# Patient Record
Sex: Male | Born: 1995 | Race: White | Hispanic: No | Marital: Married | Smoking: Never smoker
Health system: Southern US, Community
[De-identification: ages and names within clinical notes are randomized; demographics above are authoritative.]

## PROBLEM LIST (undated history)

## (undated) DIAGNOSIS — M25312 Other instability, left shoulder: Secondary | ICD-10-CM

## (undated) DIAGNOSIS — R203 Hyperesthesia: Secondary | ICD-10-CM

## (undated) DIAGNOSIS — M2520 Flail joint, unspecified joint: Secondary | ICD-10-CM

## (undated) DIAGNOSIS — M2410 Other articular cartilage disorders, unspecified site: Secondary | ICD-10-CM

## (undated) DIAGNOSIS — Z8709 Personal history of other diseases of the respiratory system: Secondary | ICD-10-CM

## (undated) DIAGNOSIS — S43006A Unspecified dislocation of unspecified shoulder joint, initial encounter: Secondary | ICD-10-CM

## (undated) HISTORY — PX: NEVUS EXCISION: SHX2090

---
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
## 2014-03-23 DIAGNOSIS — M2410 Other articular cartilage disorders, unspecified site: Secondary | ICD-10-CM

## 2014-03-23 DIAGNOSIS — S43006A Unspecified dislocation of unspecified shoulder joint, initial encounter: Secondary | ICD-10-CM

## 2014-03-23 HISTORY — DX: Other articular cartilage disorders, unspecified site: M24.10

## 2014-03-23 HISTORY — DX: Unspecified dislocation of unspecified shoulder joint, initial encounter: S43.006A

## 2014-04-13 ENCOUNTER — Other Ambulatory Visit: Payer: Self-pay | Admitting: Orthopedic Surgery

## 2014-04-13 ENCOUNTER — Encounter (HOSPITAL_BASED_OUTPATIENT_CLINIC_OR_DEPARTMENT_OTHER): Payer: Self-pay | Admitting: *Deleted

## 2014-04-20 ENCOUNTER — Ambulatory Visit (HOSPITAL_BASED_OUTPATIENT_CLINIC_OR_DEPARTMENT_OTHER)
Admission: RE | Admit: 2014-04-20 | Discharge: 2014-04-20 | Disposition: A | Discharge status: Home / Self Care | Payer: BC Managed Care – PPO | Source: Ambulatory Visit | Attending: Orthopedic Surgery | Admitting: Orthopedic Surgery

## 2014-04-20 ENCOUNTER — Encounter (HOSPITAL_BASED_OUTPATIENT_CLINIC_OR_DEPARTMENT_OTHER)
Admission: RE | Disposition: A | Discharge status: Home / Self Care | Payer: Self-pay | Source: Ambulatory Visit | Attending: Orthopedic Surgery

## 2014-04-20 ENCOUNTER — Encounter (HOSPITAL_BASED_OUTPATIENT_CLINIC_OR_DEPARTMENT_OTHER): Payer: Self-pay | Admitting: *Deleted

## 2014-04-20 ENCOUNTER — Ambulatory Visit (HOSPITAL_BASED_OUTPATIENT_CLINIC_OR_DEPARTMENT_OTHER): Payer: BC Managed Care – PPO | Admitting: Anesthesiology

## 2014-04-20 ENCOUNTER — Encounter (HOSPITAL_BASED_OUTPATIENT_CLINIC_OR_DEPARTMENT_OTHER): Payer: BC Managed Care – PPO | Admitting: Anesthesiology

## 2014-04-20 DIAGNOSIS — M25312 Other instability, left shoulder: Secondary | ICD-10-CM

## 2014-04-20 DIAGNOSIS — M24819 Other specific joint derangements of unspecified shoulder, not elsewhere classified: Secondary | ICD-10-CM | POA: Insufficient documentation

## 2014-04-20 HISTORY — PX: SHOULDER ARTHROSCOPY WITH BANKART REPAIR: SHX5673

## 2014-04-20 HISTORY — DX: Other instability, left shoulder: M25.312

## 2014-04-20 HISTORY — DX: Hyperesthesia: R20.3

## 2014-04-20 HISTORY — DX: Flail joint, unspecified joint: M25.20

## 2014-04-20 HISTORY — DX: Unspecified dislocation of unspecified shoulder joint, initial encounter: S43.006A

## 2014-04-20 HISTORY — DX: Personal history of other diseases of the respiratory system: Z87.09

## 2014-04-20 HISTORY — DX: Other articular cartilage disorders, unspecified site: M24.10

## 2014-04-20 LAB — POCT HEMOGLOBIN-HEMACUE: Hemoglobin: 15.8 g/dL (ref 13.0–17.0)

## 2014-04-20 SURGERY — SHOULDER ARTHROSCOPY WITH BANKART REPAIR
Anesthesia: General | Site: Shoulder | Laterality: Left

## 2014-04-20 MED ORDER — ACETAMINOPHEN 160 MG/5ML PO SOLN: 325.0000 mg | ORAL | Status: DC | PRN

## 2014-04-20 MED ORDER — FENTANYL CITRATE 0.05 MG/ML IJ SOLN
INTRAMUSCULAR | Status: AC
  Filled 2014-04-20: qty 2

## 2014-04-20 MED ORDER — PROPOFOL 10 MG/ML IV BOLUS
INTRAVENOUS | Status: DC | PRN
  Administered 2014-04-20: 50 mg via INTRAVENOUS
  Administered 2014-04-20: 200 mg via INTRAVENOUS

## 2014-04-20 MED ORDER — ONDANSETRON HCL 4 MG/2ML IJ SOLN
INTRAMUSCULAR | Status: DC | PRN
  Administered 2014-04-20: 4 mg via INTRAVENOUS

## 2014-04-20 MED ORDER — FENTANYL CITRATE 0.05 MG/ML IJ SOLN
25.0000 ug | INTRAMUSCULAR | Status: DC | PRN
  Administered 2014-04-20: 50 ug via INTRAVENOUS

## 2014-04-20 MED ORDER — OXYCODONE-ACETAMINOPHEN 10-325 MG PO TABS: 1.0000 | ORAL_TABLET | Freq: Four times a day (QID) | ORAL | Status: AC | PRN

## 2014-04-20 MED ORDER — MIDAZOLAM HCL 2 MG/2ML IJ SOLN
INTRAMUSCULAR | Status: AC
  Filled 2014-04-20: qty 2

## 2014-04-20 MED ORDER — PROMETHAZINE HCL 25 MG PO TABS: 25.0000 mg | ORAL_TABLET | Freq: Four times a day (QID) | ORAL | Status: AC | PRN

## 2014-04-20 MED ORDER — KETOROLAC TROMETHAMINE 30 MG/ML IJ SOLN: 15.0000 mg | Freq: Once | INTRAMUSCULAR | Status: DC | PRN

## 2014-04-20 MED ORDER — MIDAZOLAM HCL 2 MG/ML PO SYRP: 12.0000 mg | ORAL_SOLUTION | Freq: Once | ORAL | Status: DC | PRN

## 2014-04-20 MED ORDER — LACTATED RINGERS IV SOLN
INTRAVENOUS | Status: DC
Start: 2014-04-20 — End: 2014-04-20
  Administered 2014-04-20: 08:00:00 via INTRAVENOUS

## 2014-04-20 MED ORDER — LIDOCAINE HCL (CARDIAC) 20 MG/ML IV SOLN
INTRAVENOUS | Status: DC | PRN
  Administered 2014-04-20: 50 mg via INTRAVENOUS

## 2014-04-20 MED ORDER — OXYCODONE HCL 5 MG/5ML PO SOLN: 5.0000 mg | Freq: Once | ORAL | Status: AC | PRN

## 2014-04-20 MED ORDER — MIDAZOLAM HCL 2 MG/2ML IJ SOLN
1.0000 mg | INTRAMUSCULAR | Status: DC | PRN
  Administered 2014-04-20: 2 mg via INTRAVENOUS

## 2014-04-20 MED ORDER — OXYCODONE HCL 5 MG PO TABS
5.0000 mg | ORAL_TABLET | Freq: Once | ORAL | Status: AC | PRN
  Administered 2014-04-20: 5 mg via ORAL

## 2014-04-20 MED ORDER — PROMETHAZINE HCL 25 MG/ML IJ SOLN: 6.2500 mg | INTRAMUSCULAR | Status: DC | PRN

## 2014-04-20 MED ORDER — SENNA-DOCUSATE SODIUM 8.6-50 MG PO TABS: 2.0000 | ORAL_TABLET | Freq: Every day | ORAL | Status: AC

## 2014-04-20 MED ORDER — OXYCODONE HCL 5 MG PO TABS
ORAL_TABLET | ORAL | Status: AC
  Filled 2014-04-20: qty 1

## 2014-04-20 MED ORDER — FENTANYL CITRATE 0.05 MG/ML IJ SOLN
50.0000 ug | INTRAMUSCULAR | Status: DC | PRN
  Administered 2014-04-20: 100 ug via INTRAVENOUS

## 2014-04-20 MED ORDER — ACETAMINOPHEN 325 MG PO TABS: 325.0000 mg | ORAL_TABLET | ORAL | Status: DC | PRN

## 2014-04-20 MED ORDER — DEXAMETHASONE SODIUM PHOSPHATE 4 MG/ML IJ SOLN
INTRAMUSCULAR | Status: DC | PRN
  Administered 2014-04-20: 10 mg via INTRAVENOUS

## 2014-04-20 MED ORDER — ROPIVACAINE HCL 5 MG/ML IJ SOLN
INTRAMUSCULAR | Status: DC | PRN
  Administered 2014-04-20: 25 mL via PERINEURAL

## 2014-04-20 MED ORDER — CEFAZOLIN SODIUM-DEXTROSE 2-3 GM-% IV SOLR: 2.0000 g | INTRAVENOUS | Status: DC

## 2014-04-20 MED ORDER — CEFAZOLIN SODIUM-DEXTROSE 2-3 GM-% IV SOLR
INTRAVENOUS | Status: AC
  Filled 2014-04-20: qty 50

## 2014-04-20 MED ORDER — SUCCINYLCHOLINE CHLORIDE 20 MG/ML IJ SOLN
INTRAMUSCULAR | Status: DC | PRN
  Administered 2014-04-20: 60 mg via INTRAVENOUS

## 2014-04-20 MED ORDER — SODIUM CHLORIDE 0.9 % IR SOLN
Status: DC | PRN
  Administered 2014-04-20: 6000 mL

## 2014-04-20 MED ORDER — METHOCARBAMOL 500 MG PO TABS: 500.0000 mg | ORAL_TABLET | Freq: Four times a day (QID) | ORAL | Status: AC

## 2014-04-20 MED ORDER — CEFAZOLIN SODIUM-DEXTROSE 2-3 GM-% IV SOLR
2.0000 g | INTRAVENOUS | Status: AC
  Administered 2014-04-20: 2 g via INTRAVENOUS

## 2014-04-20 SURGICAL SUPPLY — 68 items
ANCH SUT SHRT 12.5 CANN EYLT (Anchor) ×2 IMPLANT
ANCHOR SUT BIOCOMP LK 2.9X12.5 (Anchor) ×2 IMPLANT
APL SKNCLS STERI-STRIP NONHPOA (GAUZE/BANDAGES/DRESSINGS) ×1
BENZOIN TINCTURE PRP APPL 2/3 (GAUZE/BANDAGES/DRESSINGS) ×2 IMPLANT
BLADE 15 SAFETY STRL DISP (BLADE) IMPLANT
BLADE CUTTER GATOR 3.5 (BLADE) ×2 IMPLANT
BLADE GREAT WHITE 4.2 (BLADE) IMPLANT
BUR OVAL 4.0 (BURR) IMPLANT
BUR OVAL 6.0 (BURR) IMPLANT
CANNULA 5.75X71 LONG (CANNULA) ×2 IMPLANT
CANNULA TWIST IN 8.25X7CM (CANNULA) ×2 IMPLANT
CANNULA TWIST IN 8.25X9CM (CANNULA) IMPLANT
DECANTER SPIKE VIAL GLASS SM (MISCELLANEOUS) IMPLANT
DRAPE INCISE IOBAN 66X45 STRL (DRAPES) ×2 IMPLANT
DRAPE SHOULDER BEACH CHAIR (DRAPES) ×2 IMPLANT
DRAPE U 20/CS (DRAPES) ×2 IMPLANT
DRAPE U-SHAPE 47X51 STRL (DRAPES) ×2 IMPLANT
DRSG PAD ABDOMINAL 8X10 ST (GAUZE/BANDAGES/DRESSINGS) ×2 IMPLANT
DURAPREP 26ML APPLICATOR (WOUND CARE) ×2 IMPLANT
ELECT REM PT RETURN 9FT ADLT (ELECTROSURGICAL)
ELECTRODE REM PT RTRN 9FT ADLT (ELECTROSURGICAL) IMPLANT
FIBERSTICK 2 (SUTURE) IMPLANT
GAUZE SPONGE 4X4 12PLY STRL (GAUZE/BANDAGES/DRESSINGS) ×2 IMPLANT
GLOVE BIO SURGEON STRL SZ 6.5 (GLOVE) ×1 IMPLANT
GLOVE BIO SURGEON STRL SZ8 (GLOVE) ×2 IMPLANT
GLOVE BIOGEL PI IND STRL 7.0 (GLOVE) IMPLANT
GLOVE BIOGEL PI IND STRL 8 (GLOVE) ×2 IMPLANT
GLOVE BIOGEL PI INDICATOR 7.0 (GLOVE) ×1
GLOVE BIOGEL PI INDICATOR 8 (GLOVE) ×2
GLOVE EXAM NITRILE LRG STRL (GLOVE) ×1 IMPLANT
GLOVE ORTHO TXT STRL SZ7.5 (GLOVE) ×2 IMPLANT
GOWN STRL REUS W/ TWL LRG LVL3 (GOWN DISPOSABLE) ×1 IMPLANT
GOWN STRL REUS W/ TWL XL LVL3 (GOWN DISPOSABLE) ×2 IMPLANT
GOWN STRL REUS W/TWL LRG LVL3 (GOWN DISPOSABLE) ×2
GOWN STRL REUS W/TWL XL LVL3 (GOWN DISPOSABLE) ×4
IMMOBILIZER SHOULDER FOAM XLGE (SOFTGOODS) IMPLANT
IV NS IRRIG 3000ML ARTHROMATIC (IV SOLUTION) ×6 IMPLANT
KIT INSERTION 2.9 PUSHLOCK (KITS) ×1 IMPLANT
KIT SHOULDER TRACTION (DRAPES) ×2 IMPLANT
LASSO 90 CVE QUICKPAS (DISPOSABLE) ×2 IMPLANT
MANIFOLD NEPTUNE II (INSTRUMENTS) ×2 IMPLANT
NDL SCORPION MULTI FIRE (NEEDLE) IMPLANT
NEEDLE SCORPION MULTI FIRE (NEEDLE) IMPLANT
PACK ARTHROSCOPY DSU (CUSTOM PROCEDURE TRAY) ×2 IMPLANT
PACK BASIN DAY SURGERY FS (CUSTOM PROCEDURE TRAY) ×2 IMPLANT
SET ARTHROSCOPY TUBING (MISCELLANEOUS) ×2
SET ARTHROSCOPY TUBING LN (MISCELLANEOUS) ×1 IMPLANT
SHEET MEDIUM DRAPE 40X70 STRL (DRAPES) ×1 IMPLANT
SLEEVE SCD COMPRESS KNEE MED (MISCELLANEOUS) ×2 IMPLANT
SLING ARM IMMOBILIZER LRG (SOFTGOODS) ×1 IMPLANT
SLING ARM IMMOBILIZER MED (SOFTGOODS) IMPLANT
SLING ARM LRG ADULT FOAM STRAP (SOFTGOODS) IMPLANT
SLING ARM MED ADULT FOAM STRAP (SOFTGOODS) IMPLANT
SLING ARM XL FOAM STRAP (SOFTGOODS) IMPLANT
STRIP CLOSURE SKIN 1/2X4 (GAUZE/BANDAGES/DRESSINGS) ×2 IMPLANT
SUT FIBERWIRE #2 38 T-5 BLUE (SUTURE)
SUT MNCRL AB 4-0 PS2 18 (SUTURE) ×1 IMPLANT
SUT PDS AB 1 CT  36 (SUTURE)
SUT PDS AB 1 CT 36 (SUTURE) IMPLANT
SUT TIGER TAPE 7 IN WHITE (SUTURE) IMPLANT
SUT VIC AB 3-0 SH 27 (SUTURE)
SUT VIC AB 3-0 SH 27X BRD (SUTURE) IMPLANT
SUTURE FIBERWR #2 38 T-5 BLUE (SUTURE) IMPLANT
TAPE FIBER 2MM 7IN #2 BLUE (SUTURE) IMPLANT
TAPE SUT LABRALTAP WHT/BLK (SUTURE) ×2 IMPLANT
TOWEL OR 17X24 6PK STRL BLUE (TOWEL DISPOSABLE) ×2 IMPLANT
TOWEL OR NON WOVEN STRL DISP B (DISPOSABLE) ×3 IMPLANT
WATER STERILE IRR 1000ML POUR (IV SOLUTION) ×2 IMPLANT

## 2014-04-20 NOTE — Op Note (Signed)
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  SURGEON:  Eulas Post, MD  PHYSICIAN ASSISTANT: Janace Litten, OPA-C, present and scrubbed throughout the case, critical for completion in a timely fashion, and for retraction, instrumentation, and closure.  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 He never fully dislocated his shoulder, but did have an MRI that demonstrated a posterior labral tear.  The risks benefits and alternatives were discussed with the patient preoperatively including but not limited to the risks of infection, bleeding, nerve injury, cardiopulmonary complications, the need for revision surgery, among others, and the patient was willing to proceed. We also discussed the risks for stiffness, recurrent symptoms, incomplete relief of symptoms, among others.  OPERATIVE IMPLANTS: Arthrex bio composite 2.9 mm short push lock anchors x2. I used a total of 2 #2 labral fiber tapes in an inverted horizontal mattress configuration. I placed one of the anchors anteriorly at the 3:00 position and one posteriorly at the 9:00 position.  OPERATIVE FINDINGS: The shoulder did not dislocate anteriorly, but did dislocate posteriorly, and he had fairly substantial patulous capsule both anteriorly and posteriorly, the superior labrum and rotator cuff was intact. The biceps tendon was normal. The glenohumeral articular cartilage was intact. There is no significant bone loss. There was a suggestion of a fairly large bare spot posteriorly on the head, which am  not sure if this represented a Hill-Sachs, or simply a fairly substantial bare spot.  OPERATIVE PROCEDURE: The patient was brought to the operating room and placed in the supine position. General anesthesia was administered. IV antibiotics were given. General anesthesia was administered.   The upper extremity was examined and found to be grossly unstable particularly to posterior testing , and there was a grind to the shoulder when I dislocated him posteriorly.. The upper extremity was prepped and draped in the usual sterile fashion. The patient was in a semilateral decubitus position.  Time out was performed. Diagnostic arthroscopy was carried out the above-named findings.   I placed 2 anterior cannulas, and the anterior labrum was not detached from the glenoid neck, but there was a substantial patulous area, which I prepared with a rasp, and then used a suture passer to pass an inverted labral fiber tape with the first arm going through the inferior humeral ligament, and then the second arm going just adjacent to, but not quite through the middle glenohumeral ligament. I did not grasp the labrum itself, in order to maintain mobility of the soft tissue advancement.   I anchored the anterior inferior glenohumeral ligament into the glenoid using a push lock anchor.   I then placed a second posterior anchor, and rasped the soft tissue of the capsule again on the posterior side, and then placed a horizontal mattress suture imbricating the posterior capsular tissue, improving capsular volume and tightness. This also repaired the posterior labrum. I did use the shaver prior to placement of the anchor to debride the posterior labrum back to a stable configuration.  Excellent soft tissue restoration of tension was achieved and the humeral head was well centered, and the arthroscopic  cannulas were removed, and the portals closed with Monocryl followed by Steri-Strips and sterile gauze. The patient was awakened  and returned to the PACU in stable and satisfactory condition. There were no complications and the patient tolerated the procedure well.

## 2014-04-20 NOTE — H&P (Signed)
PREOPERATIVE H&P  Chief Complaint: LEFT SHOULDER DISLOCATION/SUBLUXATION HUMERUS/CLOSED,DISORDER ARTICULAR CARTILAGE  HPI: Nicholas Powers is a 18 y.o. male who presents for preoperative history and physical with a diagnosis of LEFT SHOULDER DISLOCATION/SUBLUXATION HUMERUS/CLOSED,DISORDER ARTICULAR CARTILAGE. Symptoms are rated as moderate to severe, and have been worsening.  This is significantly impairing activities of daily living.  He has elected for surgical management. 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  Past Medical History  Diagnosis Date  . Shoulder dislocation 03/2014    left  . Sensitive skin   . History of asthma     as a child  . Loose joints   . 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 Nevus excision      face - as a child   History   Social History  . Marital Status: Married    Spouse Name: N/A    Number of Children: N/A  . Years of Education: N/A   Social History Main Topics  . Smoking status: Never Smoker   . Smokeless tobacco: Former Neurosurgeon  . Alcohol Use: No  . Drug Use: No  . Sexual Activity: None   Other Topics Concern  . None   Social History Narrative  . None   History reviewed. No pertinent family history. No Known Allergies Prior to Admission medications   Medication Sig Start Date End Date Taking? Authorizing Provider  ibuprofen (ADVIL,MOTRIN) 200 MG tablet Take 200 mg by mouth every 6 (six) hours as needed.   Yes Historical Provider, MD     Positive ROS: All other systems have been reviewed and were otherwise negative with the exception of those mentioned in the HPI and as above.  Physical Exam: General: Alert, no acute distress Cardiovascular: No pedal edema Respiratory: No cyanosis, no use of accessory musculature GI: No organomegaly, abdomen is soft and non-tender Skin: No lesions in the area of chief  complaint Neurologic: Sensation intact distally Psychiatric: Patient is competent for consent with normal mood and affect Lymphatic: No axillary or cervical lymphadenopathy  MUSCULOSKELETAL: Left shoulder has full motion, with intact cuff strength, no pain over the a.c. joint, positive labral signs.  Assessment: LEFT SHOULDER DISLOCATION/SUBLUXATION HUMERUS/CLOSED,DISORDER ARTICULAR CARTILAGE  Plan: Plan for Procedure(s): LEFT ARTHROSCOPY SHOULDER CAPSULORRHAPHY/DEBRIDEMENT LIMITED  The risks benefits and alternatives were discussed with the patient including but not limited to the risks of nonoperative treatment, versus surgical intervention including infection, bleeding, nerve injury,  blood clots, cardiopulmonary complications, morbidity, mortality, among others, and they were willing to proceed.   Eulas Post, MD Cell [phone]   04/20/2014 7:26 AM

## 2014-04-20 NOTE — Anesthesia Preprocedure Evaluation (Signed)
Anesthesia Evaluation  Patient identified by MRN, date of birth, ID band Patient awake    History of Anesthesia Complications Negative for: history of anesthetic complications  Airway Mallampati: I TM Distance: >3 FB Neck ROM: Full    Dental  (+) Teeth Intact   Pulmonary neg pulmonary ROS,  breath sounds clear to auscultation- rhonchi        Cardiovascular negative cardio ROS  Rhythm:Regular     Neuro/Psych negative neurological ROS     GI/Hepatic negative GI ROS, Neg liver ROS,   Endo/Other  negative endocrine ROS  Renal/GU negative Renal ROS     Musculoskeletal   Abdominal   Peds  Hematology negative hematology ROS (+)   Anesthesia Other Findings   Reproductive/Obstetrics                           Anesthesia Physical Anesthesia Plan  ASA: I  Anesthesia Plan: General   Post-op Pain Management:    Induction: Intravenous  Airway Management Planned: Oral ETT  Additional Equipment: None  Intra-op Plan:   Post-operative Plan: Extubation in OR  Informed Consent: I have reviewed the patients History and Physical, chart, labs and discussed the procedure including the risks, benefits and alternatives for the proposed anesthesia with the patient or authorized representative who has indicated his/her understanding and acceptance.   Dental advisory given  Plan Discussed with: CRNA and Surgeon  Anesthesia Plan Comments:         Anesthesia Quick Evaluation

## 2014-04-20 NOTE — Transfer of Care (Addendum)
Immediate Anesthesia Transfer of Care Note  Patient: Nicholas Powers  Procedure(s) Performed: Procedure(s): LEFT ARTHROSCOPY SHOULDER CAPSULORRHAPHY/DEBRIDEMENT LIMITED (Left)  Patient Location: PACU  Anesthesia Type:General and Regional  Level of Consciousness: awake and sedated  Airway & Oxygen Therapy: Patient Spontanous Breathing and Patient connected to face mask oxygen  Post-op Assessment: Report given to PACU RN and Post -op Vital signs reviewed and stable  Post vital signs: Reviewed and stable  Complications: No apparent anesthesia complications

## 2014-04-20 NOTE — Progress Notes (Signed)
Assisted Dr. Moser with left, ultrasound guided, interscalene  block. Side rails up, monitors on throughout procedure. See vital signs in flow sheet. Tolerated Procedure well. 

## 2014-04-20 NOTE — Anesthesia Procedure Notes (Addendum)
Anesthesia Regional Block:  Interscalene brachial plexus block  Pre-Anesthetic Checklist: ,, timeout performed, Correct Patient, Correct Site, Correct Laterality, Correct Procedure, Correct Position, site marked, Risks and benefits discussed,  Surgical consent,  Pre-op evaluation,  At surgeon's request and post-op pain management  Laterality: Upper and Left  Prep: chloraprep       Needles:  Injection technique: Single-shot  Needle Type: Echogenic Stimulator Needle          Additional Needles:  Procedures: ultrasound guided (picture in chart) and nerve stimulator Interscalene brachial plexus block Narrative:  Start time: 04/20/2014 9:41 AM End time: 04/20/2014 9:46 AM Injection made incrementally with aspirations every 5 mL.  Performed by: Personally  Anesthesiologist: Moser  Additional Notes: H+P and labs reviewed, risks and benefits discussed with patient, procedure tolerated well without complications   Procedure Name: Intubation Performed by: York Grice Pre-anesthesia Checklist: Patient identified, Timeout performed, Emergency Drugs available, Suction available and Patient being monitored Patient Re-evaluated:Patient Re-evaluated prior to inductionOxygen Delivery Method: Circle system utilized Preoxygenation: Pre-oxygenation with 100% oxygen Intubation Type: IV induction Ventilation: Mask ventilation without difficulty Laryngoscope Size: Miller and 2 Grade View: Grade I Tube type: Oral Tube size: 8.0 mm Number of attempts: 1 Airway Equipment and Method: Stylet Placement Confirmation: ETT inserted through vocal cords under direct vision,  breath sounds checked- equal and bilateral and positive ETCO2 Secured at: 22 cm Tube secured with: Tape Dental Injury: Teeth and Oropharynx as per pre-operative assessment

## 2014-04-20 NOTE — Discharge Instructions (Signed)
Diet: As you were doing prior to hospitalization  ° °Shower:  May shower but keep the wounds dry, use an occlusive plastic wrap, NO SOAKING IN TUB.  If the bandage gets wet, change with a clean dry gauze. ° °Dressing:  You may change your dressing 3-5 days after surgery.  Then change the dressing daily with sterile gauze dressing.   ° °There are sticky tapes (steri-strips) on your wounds and all the stitches are absorbable.  Leave the steri-strips in place when changing your dressings, they will peel off with time, usually 2-3 weeks. ° °Activity:  Increase activity slowly as tolerated, but follow the weight bearing instructions below.  No lifting or driving for 6 weeks. ° °Weight Bearing:   Sling at all times.   ° °To prevent constipation: you may use a stool softener such as - ° °Colace (over the counter) 100 mg by mouth twice a day  °Drink plenty of fluids (prune juice may be helpful) and high fiber foods °Miralax (over the counter) for constipation as needed.   ° °Itching:  If you experience itching with your medications, try taking only a single pain pill, or even half a pain pill at a time.  You may take up to 10 pain pills per day, and you can also use benadryl over the counter for itching or also to help with sleep.  ° °Precautions:  If you experience chest pain or shortness of breath - call 911 immediately for transfer to the hospital emergency department!! ° °If you develop a fever greater that 101 F, purulent drainage from wound, increased redness or drainage from wound, or calf pain -- Call the office at [phone]                                                °Follow- Up Appointment:  Please call for an appointment to be seen in 2 weeks [place] - [phone] ° ° ° ° °Regional Anesthesia Blocks ° °1. Numbness or the inability to move the "blocked" extremity may last from 3-48 hours after placement. The length of time depends on the medication injected and your individual response to the  medication. If the numbness is not going away after 48 hours, call your surgeon. ° °2. The extremity that is blocked will need to be protected until the numbness is gone and the  Strength has returned. Because you cannot feel it, you will need to take extra care to avoid injury. Because it may be weak, you may have difficulty moving it or using it. You may not know what position it is in without looking at it while the block is in effect. ° °3. For blocks in the legs and feet, returning to weight bearing and walking needs to be done carefully. You will need to wait until the numbness is entirely gone and the strength has returned. You should be able to move your leg and foot normally before you try and bear weight or walk. You will need someone to be with you when you first try to ensure you do not fall and possibly risk injury. ° °4. Bruising and tenderness at the needle site are common side effects and will resolve in a few days. ° °5. Persistent numbness or new problems with movement should be communicated to the surgeon or the [facility] ([phone])/ [facility] ([phone]). ° ° ° °Post Anesthesia Home Care Instructions ° °Activity: °Get plenty of rest for the remainder of the day. A responsible adult should stay with you for 24 hours following the procedure.  °For the next 24 hours, DO NOT: °-Drive a car °-Operate machinery °-Drink alcoholic beverages °-Take any medication unless instructed by your physician °-Make any legal decisions or sign important papers. ° °Meals: °Start with liquid foods such as gelatin or soup. Progress to regular foods as tolerated. Avoid greasy, spicy, heavy foods. If nausea and/or vomiting occur, drink only clear liquids until the nausea and/or vomiting subsides. Call your physician if vomiting continues. ° °Special Instructions/Symptoms: °Your throat may feel dry or sore from the anesthesia or the breathing tube placed in your throat during surgery.  If this causes discomfort, gargle with warm salt water. The discomfort should disappear within 24 hours. ° °

## 2014-04-20 NOTE — Anesthesia Postprocedure Evaluation (Signed)
  Anesthesia Post-op Note  Patient: Nicholas Powers  Procedure(s) Performed: Procedure(s): LEFT ARTHROSCOPY SHOULDER CAPSULORRHAPHY/DEBRIDEMENT LIMITED (Left)  Patient Location: PACU  Anesthesia Type:General  Level of Consciousness: awake and alert   Airway and Oxygen Therapy: Patient Spontanous Breathing and Patient connected to nasal cannula oxygen  Post-op Pain: mild  Post-op Assessment: Post-op Vital signs reviewed, Patient's Cardiovascular Status Stable, Respiratory Function Stable, Patent Airway, No signs of Nausea or vomiting and Pain level controlled  Post-op Vital Signs: Reviewed and stable  Last Vitals:  Filed Vitals:   04/20/14 1230  BP: 127/76  Pulse: 78  Temp:   Resp: 12    Complications: No apparent anesthesia complications

## 2014-04-20 NOTE — Addendum Note (Signed)
Addendum created 04/20/14 1258 by Corky Sox, MD   Modules edited: Notes Section   Notes Section:  File: [id number]

## 2014-04-23 ENCOUNTER — Encounter (HOSPITAL_BASED_OUTPATIENT_CLINIC_OR_DEPARTMENT_OTHER): Payer: Self-pay | Admitting: Orthopedic Surgery

## 2014-04-30 ENCOUNTER — Encounter (HOSPITAL_BASED_OUTPATIENT_CLINIC_OR_DEPARTMENT_OTHER): Payer: Self-pay | Admitting: Orthopedic Surgery

## 2024-06-15 ENCOUNTER — Ambulatory Visit: Admitting: Family Medicine
